# Patient Record
Sex: Female | Born: 1980 | Hispanic: No | Marital: Married | Smoking: Never smoker
Health system: Southern US, Community
[De-identification: ages and names within clinical notes are randomized; demographics above are authoritative.]

## PROBLEM LIST (undated history)

## (undated) HISTORY — PX: MYOMECTOMY: SHX85

---

## 2002-01-14 ENCOUNTER — Other Ambulatory Visit: Admission: RE | Admit: 2002-01-14 | Discharge: 2002-01-14 | Payer: Self-pay | Admitting: Gynecology

## 2003-02-06 ENCOUNTER — Other Ambulatory Visit: Admission: RE | Admit: 2003-02-06 | Discharge: 2003-02-06 | Payer: Self-pay | Admitting: Gynecology

## 2017-09-14 ENCOUNTER — Emergency Department (HOSPITAL_BASED_OUTPATIENT_CLINIC_OR_DEPARTMENT_OTHER)
Admission: EM | Admit: 2017-09-14 | Discharge: 2017-09-14 | Disposition: A | Discharge status: Home / Self Care | Payer: BLUE CROSS/BLUE SHIELD | Attending: Emergency Medicine | Admitting: Emergency Medicine

## 2017-09-14 ENCOUNTER — Emergency Department (HOSPITAL_BASED_OUTPATIENT_CLINIC_OR_DEPARTMENT_OTHER): Payer: BLUE CROSS/BLUE SHIELD

## 2017-09-14 ENCOUNTER — Other Ambulatory Visit: Payer: Self-pay

## 2017-09-14 ENCOUNTER — Encounter (HOSPITAL_BASED_OUTPATIENT_CLINIC_OR_DEPARTMENT_OTHER): Payer: Self-pay

## 2017-09-14 DIAGNOSIS — S29002A Unspecified injury of muscle and tendon of back wall of thorax, initial encounter: Secondary | ICD-10-CM | POA: Diagnosis present

## 2017-09-14 DIAGNOSIS — S29012A Strain of muscle and tendon of back wall of thorax, initial encounter: Secondary | ICD-10-CM | POA: Insufficient documentation

## 2017-09-14 DIAGNOSIS — Y9389 Activity, other specified: Secondary | ICD-10-CM | POA: Diagnosis not present

## 2017-09-14 DIAGNOSIS — Y92410 Unspecified street and highway as the place of occurrence of the external cause: Secondary | ICD-10-CM | POA: Insufficient documentation

## 2017-09-14 DIAGNOSIS — Y999 Unspecified external cause status: Secondary | ICD-10-CM | POA: Insufficient documentation

## 2017-09-14 DIAGNOSIS — S239XXA Sprain of unspecified parts of thorax, initial encounter: Secondary | ICD-10-CM
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
## 2017-09-14 NOTE — ED Notes (Signed)
ED Provider at bedside. 
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
